# Patient Record
Sex: Female | Born: 2000
Health system: Southern US, Community
[De-identification: ages and names within clinical notes are randomized; demographics above are authoritative.]

---
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
## 2018-01-05 ENCOUNTER — Ambulatory Visit: Payer: 59 | Admitting: Internal Medicine

## 2018-05-26 ENCOUNTER — Other Ambulatory Visit: Payer: Self-pay | Admitting: Internal Medicine

## 2019-04-10 ENCOUNTER — Ambulatory Visit: Payer: 59 | Admitting: Podiatry

## 2019-05-08 ENCOUNTER — Ambulatory Visit: Payer: 59 | Admitting: Podiatry

## 2019-05-16 ENCOUNTER — Telehealth: Payer: Self-pay

## 2019-05-16 NOTE — Telephone Encounter (Signed)
I called to schedule the patient a virtual visit with Dr. Derrel Nip and the phone was disconnected.  Kaitlyn Franko,cma

## 2019-05-24 ENCOUNTER — Ambulatory Visit: Payer: 59 | Admitting: Podiatry

## 2019-06-05 ENCOUNTER — Ambulatory Visit (INDEPENDENT_AMBULATORY_CARE_PROVIDER_SITE_OTHER): Payer: 59 | Admitting: Podiatry

## 2019-06-05 ENCOUNTER — Encounter: Payer: Self-pay | Admitting: Podiatry

## 2019-06-05 ENCOUNTER — Ambulatory Visit (INDEPENDENT_AMBULATORY_CARE_PROVIDER_SITE_OTHER): Payer: 59

## 2019-06-05 ENCOUNTER — Other Ambulatory Visit: Payer: Self-pay

## 2019-06-05 VITALS — BP 114/79 | HR 82 | Resp 16

## 2019-06-05 DIAGNOSIS — S92811A Other fracture of right foot, initial encounter for closed fracture: Secondary | ICD-10-CM

## 2019-06-05 DIAGNOSIS — M2011 Hallux valgus (acquired), right foot: Secondary | ICD-10-CM

## 2019-06-05 DIAGNOSIS — M778 Other enthesopathies, not elsewhere classified: Secondary | ICD-10-CM

## 2019-06-05 NOTE — Progress Notes (Signed)
  Subjective:  Patient ID: Lori Hoffman, female    DOB: 04/13/2001,  MRN: [id number] HPI Chief Complaint  Patient presents with  . Foot Pain    1st MPJ right - aching x couple years, worsened in the last few months, some redness and swelling, takes Ibuprofen, shoes sometimes uncomfortable  . New Patient (Initial Visit)    18 y.o. female presents with the above complaint.   ROS: Denies fever chills nausea vomiting muscle aches pains calf pain back pain chest pain shortness of breath.  No past medical history on file.   Current Outpatient Medications:  .  amoxicillin (AMOXIL) 500 MG capsule, Take 500 mg by mouth 3 (three) times daily., Disp: , Rfl:  .  ASHLYNA 0.15-0.03 &0.01 MG tablet, TAKE 1 TABLET BY MOUTH ONCE DAILY, Disp: 91 tablet, Rfl: 4  Allergies  Allergen Reactions  . Cephalexin     Other reaction(s): Vomiting   Review of Systems Objective:   Vitals:   06/05/19 1537  BP: 114/79  Pulse: 82  Resp: 16    General: Well developed, nourished, in no acute distress, alert and oriented x3   Dermatological: Skin is warm, dry and supple bilateral. Nails x 10 are well maintained; remaining integument appears unremarkable at this time. There are no open sores, no preulcerative lesions, no rash or signs of infection present.  Vascular: Dorsalis Pedis artery and Posterior Tibial artery pedal pulses are 2/4 bilateral with immedate capillary fill time. Pedal hair growth present. No varicosities and no lower extremity edema present bilateral.   Neruologic: Grossly intact via light touch bilateral. Vibratory intact via tuning fork bilateral. Protective threshold with Semmes Wienstein monofilament intact to all pedal sites bilateral. Patellar and Achilles deep tendon reflexes 2+ bilateral. No Babinski or clonus noted bilateral.   Musculoskeletal: No gross boney pedal deformities bilateral. No pain, crepitus, or limitation noted with foot and ankle range of motion bilateral. Muscular  strength 5/5 in all groups tested bilateral.  Gait: Unassisted, Nonantalgic.    Radiographs:  Radiographs taken today demonstrate an osseously mature individual with mild increase in the first intermetatarsal angle of the right foot.  Appears that there is a nondisplaced fracture of the tibial sesamoid.  Some soft tissue edema overlying the first metatarsophalangeal joint.  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    Plan: Discussed in great detail today surgery possibly removing the sesamoid possibly from just correcting the bunion deformity.  So at this point to help delineate what needs to be done we will go ahead and inject the first metatarsophalangeal joint with dexamethasone and local anesthetic I will also follow-up with her in about a month at which time we will discuss MRI.     Julietta Batterman T. [place], [place]

## 2019-06-14 ENCOUNTER — Other Ambulatory Visit: Payer: Self-pay

## 2019-06-19 ENCOUNTER — Encounter: Payer: Self-pay | Admitting: Internal Medicine

## 2019-06-19 ENCOUNTER — Encounter

## 2019-06-19 ENCOUNTER — Other Ambulatory Visit: Payer: Self-pay

## 2019-06-19 ENCOUNTER — Ambulatory Visit (INDEPENDENT_AMBULATORY_CARE_PROVIDER_SITE_OTHER): Payer: 59 | Admitting: Internal Medicine

## 2019-06-19 VITALS — Ht 64.0 in | Wt 131.0 lb

## 2019-06-19 DIAGNOSIS — N921 Excessive and frequent menstruation with irregular cycle: Secondary | ICD-10-CM

## 2019-06-19 DIAGNOSIS — Z13220 Encounter for screening for lipoid disorders: Secondary | ICD-10-CM

## 2019-06-19 DIAGNOSIS — N946 Dysmenorrhea, unspecified: Secondary | ICD-10-CM

## 2019-06-19 DIAGNOSIS — N926 Irregular menstruation, unspecified: Secondary | ICD-10-CM | POA: Diagnosis not present

## 2019-06-19 DIAGNOSIS — F411 Generalized anxiety disorder: Secondary | ICD-10-CM

## 2019-06-19 MED ORDER — LEVONORGEST-ETH ESTRAD 91-DAY 0.15-0.03 &0.01 MG PO TABS: 1.0000 | ORAL_TABLET | Freq: Every day | ORAL | 4 refills | Status: DC

## 2019-06-19 NOTE — Progress Notes (Signed)
Virtual Visit via [email or web address]  This visit type was conducted due to national recommendations for restrictions regarding the COVID-19 pandemic (e.g. social distancing).  This format is felt to be most appropriate for this patient at this time.  All issues noted in this document were discussed and addressed.  No physical exam was performed (except for noted visual exam findings with Video Visits).   I connected with@ on 06/19/19 at  3:30 PM EST by a video enabled telemedicine application and verified that I am speaking with the correct person using two identifiers. Location patient: home Location provider: work or home office Persons participating in the virtual visit: patient, provider  I discussed the limitations, risks, security and privacy concerns of performing an evaluation and management service by telephone and the availability of in person appointments. I also discussed with the patient that there may be a patient responsible charge related to this service. The patient expressed understanding and agreed to proceed.   Reason for visit: irregular menses HPI:  19 yr old female last seen  May 2019 for GAD and started on Effexor..  Did not return for follow up.   presents for discussion of birth control options   POSITIVE FOR COVID 19  IN [place] .  Was asymptomatic .  Only complaint was fatigue lasted 2-3 days   At last visit was taking Seasonale  To reduce frequency of periods given history of dysmenorrhea. Currently taking  Ashlynia  0.15   - 0.03 & 0.01  levonorgesterel-EE continuous cycle,  But having 10 days of bleeding during month 2,  And a very heavy period at the end of Month 3 .  Has not missed any doses and no recent medicatio change.   GAD:  No longer taking Sertraline or Effexor .  Currently a junior at [employer] , has adjusted well to living in a house .   ROS: See pertinent positives and negatives per HPI.  History reviewed. No pertinent past medical history.  History reviewed. No  pertinent surgical history.  Family History  Problem Relation Age of Onset  . Hyperlipidemia Maternal Grandmother   . Hypertension Maternal Grandmother   . Lung cancer Maternal Grandfather   . Breast cancer Paternal Grandmother     SOCIAL HX:  reports that she has never smoked. She has never used smokeless tobacco. She reports that she does not drink alcohol or use drugs.   Current Outpatient Medications:  .  Levonorgestrel-Ethinyl Estradiol (SEASONIQUE) 0.15-0.03 &0.01 MG tablet, Take 1 tablet by mouth daily., Disp: 1 Package, Rfl: 4  EXAM:  VITALS per patient if applicable:  GENERAL: alert, oriented, appears well and in no acute distress  HEENT: atraumatic, conjunttiva clear, no obvious abnormalities on inspection of external nose and ears  NECK: normal movements of the head and neck  LUNGS: on inspection no signs of respiratory distress, breathing rate appears normal, no obvious gross SOB, gasping or wheezing  CV: no obvious cyanosis  MS: moves all visible extremities without noticeable abnormality  PSYCH/NEURO: pleasant and cooperative, no obvious depression or anxiety, speech and thought processing grossly intact  ASSESSMENT AND PLAN:  Discussed the following assessment and plan:  Dysmenorrhea, unspecified  Menometrorrhagia - Plan: Thyroid Panel With TSH, Comprehensive metabolic panel  Screening for lipid disorders - Plan: Lipid panel  Irregular menses  Generalized anxiety disorder  Irregular menses She is taking continuous OCPS but having interval bleeding during month 2 and at the end of the 3 month cycle. She is at the maximal  dose of estrogen and levonorgesterol..  Will try Seasonique and if no improvement will change to 30 day regimen .  Checking thyroid and CMET   Generalized anxiety disorder Resolved .  No longer taking medication     I discussed the assessment and treatment plan with the patient. The patient was provided an opportunity to ask  questions and all were answered. The patient agreed with the plan and demonstrated an understanding of the instructions.   The patient was advised to call back or seek an in-person evaluation if the symptoms worsen or if the condition fails to improve as anticipated.    Crecencio Mc, MD

## 2019-06-19 NOTE — Assessment & Plan Note (Signed)
Resolved .  No longer taking medication

## 2019-06-19 NOTE — Assessment & Plan Note (Addendum)
She is taking continuous OCPS but having interval bleeding during month 2 and at the end of the 3 month cycle. She is at the maximal dose of estrogen and levonorgesterol..  Will try Seasonique and if no improvement will change to 30 day regimen .  Checking thyroid and CMET

## 2019-07-01 MED ORDER — LEVONORGEST-ETH ESTRAD 91-DAY 0.15-0.03 &0.01 MG PO TABS: 1.0000 | ORAL_TABLET | Freq: Every day | ORAL | 0 refills | Status: DC

## 2019-07-13 ENCOUNTER — Other Ambulatory Visit: Payer: Self-pay

## 2019-07-13 MED ORDER — LEVONORGEST-ETH ESTRAD 91-DAY 0.15-0.03 &0.01 MG PO TABS: 1.0000 | ORAL_TABLET | Freq: Every day | ORAL | 0 refills | Status: DC

## 2019-07-31 ENCOUNTER — Telehealth: Payer: Self-pay | Admitting: Internal Medicine

## 2019-07-31 MED ORDER — LEVONORGEST-ETH ESTRAD 91-DAY 0.15-0.03 &0.01 MG PO TABS: 1.0000 | ORAL_TABLET | Freq: Every day | ORAL | 0 refills | Status: DC

## 2019-07-31 NOTE — Telephone Encounter (Signed)
YES`!!   IT WAS SENT ON JAN 28  SO PLEASE CALL PHARMACY AND FIND OUT WHY THEY DID NOT FILL   FYI I WILL NEVER WITHHOLD BIRTH CONTROL JUST BECAUSE OF LABS SO YOU ARE AUTHORIZED TO REFILL

## 2019-07-31 NOTE — Telephone Encounter (Signed)
Medication has been refilled per Dr. Darrick Huntsman message. Pt is aware.

## 2019-07-31 NOTE — Telephone Encounter (Signed)
Pt has a lab appt scheduled for 08/28/2019. Is it okay to fill until then? She has had two other lab appts but has rescheduled them.

## 2019-07-31 NOTE — Telephone Encounter (Signed)
Pt needs a refill on Levonorgestrel-Ethinyl Estradiol (SEASONIQUE) 0.15-0.03 &0.01 MG tablet. Pharmacy states that they have not received refill from our office. Pt is about out of medication and is frustrated. She would like to know if she can come back and pick up rx in office? Please advise.

## 2019-08-01 ENCOUNTER — Telehealth: Payer: Self-pay | Admitting: Internal Medicine

## 2019-08-01 NOTE — Telephone Encounter (Signed)
Spoke with pt and she stated that the birth control that she picked up on 07/24/2019 was still the Ashlyna and she was switched to the [place] in January. I spoke with the pharmacy the first time and they stated that they couldn't fill the rx because she had just picked one up on 07/24/2019. Called pt back explained to pt she stated that her mother picks up the rx because she is in college and her mother didn't know about the change. The pt stated that she wanted something different because she was tired of having her period once every 3 months. When I spoke with the pharmacy again they stated that the reason they filled the rx as the Ashylna was because the pt was going to have to $300 even with insurance and a discount card. Pt is wanting to know if there is another medication that can be sent in that would be cheaper and allow her to have her periods once a month or even once every two months.

## 2019-08-01 NOTE — Telephone Encounter (Signed)
Pt is requesting a call back. She said you left a message for her to call.

## 2019-08-02 MED ORDER — LEVONORGESTREL-ETHINYL ESTRAD 0.15-30 MG-MCG PO TABS: 1.0000 | ORAL_TABLET | Freq: Every day | ORAL | 11 refills | Status: DC

## 2019-08-02 NOTE — Telephone Encounter (Signed)
Yes I have sent in a rw rx for Portia 28,  This will produce a 28 day cycle with a monthly menstrual period .

## 2019-08-02 NOTE — Telephone Encounter (Signed)
Mychart message has been sent to pt.

## 2019-08-04 NOTE — Telephone Encounter (Signed)
err

## 2019-08-07 ENCOUNTER — Other Ambulatory Visit: Payer: 59

## 2019-08-21 ENCOUNTER — Other Ambulatory Visit: Payer: 59

## 2019-08-28 ENCOUNTER — Other Ambulatory Visit (INDEPENDENT_AMBULATORY_CARE_PROVIDER_SITE_OTHER): Payer: 59

## 2019-08-28 ENCOUNTER — Other Ambulatory Visit: Payer: Self-pay

## 2019-08-28 DIAGNOSIS — Z13220 Encounter for screening for lipoid disorders: Secondary | ICD-10-CM

## 2019-08-28 DIAGNOSIS — N921 Excessive and frequent menstruation with irregular cycle: Secondary | ICD-10-CM | POA: Diagnosis not present

## 2019-08-28 LAB — COMPREHENSIVE METABOLIC PANEL
ALT: 14 U/L (ref 0–35)
AST: 17 U/L (ref 0–37)
Albumin: 4.3 g/dL (ref 3.5–5.2)
Alkaline Phosphatase: 52 U/L (ref 39–117)
BUN: 10 mg/dL (ref 6–23)
CO2: 22 mEq/L (ref 19–32)
Calcium: 9.5 mg/dL (ref 8.4–10.5)
Chloride: 103 mEq/L (ref 96–112)
Creatinine, Ser: 0.86 mg/dL (ref 0.40–1.20)
GFR: 83.44 mL/min (ref >60.00)
Glucose, Bld: 91 mg/dL (ref 70–99)
Potassium: 4.4 mEq/L (ref 3.5–5.1)
Sodium: 134 mEq/L — ABNORMAL LOW (ref 135–145)
Total Bilirubin: 0.6 mg/dL (ref 0.2–1.2)
Total Protein: 7 g/dL (ref 6.0–8.3)

## 2019-08-28 LAB — LIPID PANEL
Cholesterol: 127 mg/dL (ref 0–200)
HDL: 47.7 mg/dL (ref >39.00)
LDL Cholesterol: 65 mg/dL (ref 0–99)
NonHDL: 79.23
Total CHOL/HDL Ratio: 3
Triglycerides: 70 mg/dL (ref 0.0–149.0)
VLDL: 14 mg/dL (ref 0.0–40.0)

## 2019-08-29 LAB — THYROID PANEL WITH TSH
Free Thyroxine Index: 2.9 (ref 1.4–3.8)
T3 Uptake: 31 % (ref 22–35)
T4, Total: 9.5 ug/dL (ref 5.3–11.7)
TSH: 1.86 mIU/L

## 2020-04-25 LAB — HM PAP SMEAR: HM Pap smear: NEGATIVE

## 2020-05-22 ENCOUNTER — Other Ambulatory Visit: Payer: Self-pay | Admitting: Internal Medicine

## 2021-01-07 ENCOUNTER — Other Ambulatory Visit: Payer: Self-pay | Admitting: Internal Medicine

## 2021-12-04 ENCOUNTER — Telehealth: Payer: Self-pay

## 2021-12-04 NOTE — Telephone Encounter (Signed)
LMTCB to inquire if she is still a patient here, has not been seen since 2021 if so can I get you an appt scheduled or does she have a new provider?

## 2021-12-04 NOTE — Telephone Encounter (Signed)
Pt called stating she is still a current patient. Pt stated she has to get her schedule and call back because she does not live here

## 2021-12-04 NOTE — Telephone Encounter (Signed)
NOTED

## 2022-05-04 ENCOUNTER — Ambulatory Visit (INDEPENDENT_AMBULATORY_CARE_PROVIDER_SITE_OTHER): Payer: BC Managed Care – PPO | Admitting: Internal Medicine

## 2022-05-04 ENCOUNTER — Encounter: Payer: Self-pay | Admitting: Internal Medicine

## 2022-05-04 VITALS — BP 118/82 | HR 69 | Temp 98.7°F | Ht 64.0 in | Wt 125.2 lb

## 2022-05-04 DIAGNOSIS — N942 Vaginismus: Secondary | ICD-10-CM | POA: Diagnosis not present

## 2022-05-04 DIAGNOSIS — Z79899 Other long term (current) drug therapy: Secondary | ICD-10-CM

## 2022-05-04 DIAGNOSIS — E785 Hyperlipidemia, unspecified: Secondary | ICD-10-CM | POA: Diagnosis not present

## 2022-05-04 DIAGNOSIS — Z0000 Encounter for general adult medical examination without abnormal findings: Secondary | ICD-10-CM

## 2022-05-04 NOTE — Progress Notes (Unsigned)
The patient is here for annual preventive examination and management of other chronic and acute problems.   The risk factors are reflected in the social history.   The roster of all physicians providing medical care to patient - is listed in the Snapshot section of the chart.   Activities of daily living:  The patient is 100% independent in all ADLs: dressing, toileting, feeding as well as independent mobility   Home safety : The patient has smoke detectors in the home. They wear seatbelts.  There are no unsecured firearms at home. There is no violence in the home.    There is no risks for hepatitis, STDs or HIV. There is no   history of blood transfusion. They have no travel history to infectious disease endemic areas of the world.   The patient has seen their dentist in the last six month. They have seen their eye doctor in the last year. The patinet  denies slight hearing difficulty with regard to whispered voices and some television programs.  They have deferred audiologic testing in the last year.  They do not  have excessive sun exposure. Discussed the need for sun protection: hats, long sleeves and use of sunscreen if there is significant sun exposure.    Diet: the importance of a healthy diet is discussed. They do have a healthy diet.   The benefits of regular aerobic exercise were discussed. The patient  exercises  3 to 5 days per week  for  60 minutes.    Depression screen: there are no signs or vegative symptoms of depression- irritability, change in appetite, anhedonia, sadness/tearfullness.   The following portions of the patient's history were reviewed and updated as appropriate: allergies, current medications, past family history, past medical history,  past surgical history, past social history  and problem list.   Visual acuity was not assessed per patient preference since the patient has regular follow up with an  ophthalmologist. Hearing and body mass index were assessed and  reviewed.    During the course of the visit the patient was educated and counseled about appropriate screening and preventive services including : fall prevention , diabetes screening, nutrition counseling, colorectal cancer screening, and recommended immunizations.    Chief Complaint:  HPI     Annual Exam    Additional comments: Physical       Last edited by Sandy Salaam, CMA on 05/04/2022  3:31 PM.        Review of Symptoms  Patient denies headache, fevers, malaise, unintentional weight loss, skin rash, eye pain, sinus congestion and sinus pain, sore throat, dysphagia,  hemoptysis , cough, dyspnea, wheezing, chest pain, palpitations, orthopnea, edema, abdominal pain, nausea, melena, diarrhea, constipation, flank pain, dysuria, hematuria, urinary  Frequency, nocturia, numbness, tingling, seizures,  Focal weakness, Loss of consciousness,  Tremor, insomnia, depression, anxiety, and suicidal ideation.    Physical Exam:  BP 118/82 (BP Location: Right Arm, Patient Position: Sitting, Cuff Size: Normal)   Pulse 69   Temp 98.7 F (37.1 C) (Oral)   Ht 5\' 4"  (1.626 m)   Wt 125 lb 3.2 oz (56.8 kg)   SpO2 99%   BMI 21.49 kg/m    Vaginismus resolved with stopping OCP. No longer having intercourse since 2021  Working remotely for a [employer].  Work/life balance.   Walking  with dogs daily.  Has stayed healthy   Assessment and Plan:  No problem-specific Assessment & Plan notes found for this encounter.   Updated Medication List  Outpatient Encounter Medications as of 05/04/2022  Medication Sig   [DISCONTINUED] ALTAVERA 0.15-30 MG-MCG tablet TAKE 1 TABLET BY MOUTH EVERY DAY (Patient not taking: Reported on 05/04/2022)   No facility-administered encounter medications on file as of 05/04/2022.

## 2022-05-04 NOTE — Patient Instructions (Addendum)
Good to see you!  I strongly advise taking a multi vitamin that contains folic acid.  (To prevent  neural tube defects should you get pregnant

## 2022-05-05 DIAGNOSIS — N942 Vaginismus: Secondary | ICD-10-CM | POA: Insufficient documentation

## 2022-05-05 DIAGNOSIS — Z0000 Encounter for general adult medical examination without abnormal findings: Secondary | ICD-10-CM | POA: Insufficient documentation

## 2022-05-05 LAB — COMPREHENSIVE METABOLIC PANEL
ALT: 9 U/L (ref 0–35)
AST: 14 U/L (ref 0–37)
Albumin: 4.8 g/dL (ref 3.5–5.2)
Alkaline Phosphatase: 51 U/L (ref 39–117)
BUN: 13 mg/dL (ref 6–23)
CO2: 26 mEq/L (ref 19–32)
Calcium: 9.5 mg/dL (ref 8.4–10.5)
Chloride: 104 mEq/L (ref 96–112)
Creatinine, Ser: 0.91 mg/dL (ref 0.40–1.20)
GFR: 88.93 mL/min (ref >60.00)
Glucose, Bld: 81 mg/dL (ref 70–99)
Potassium: 3.6 mEq/L (ref 3.5–5.1)
Sodium: 138 mEq/L (ref 135–145)
Total Bilirubin: 0.4 mg/dL (ref 0.2–1.2)
Total Protein: 7.2 g/dL (ref 6.0–8.3)

## 2022-05-05 LAB — CBC WITH DIFFERENTIAL/PLATELET
Basophils Absolute: 0.1 10*3/uL (ref 0.0–0.1)
Basophils Relative: 0.7 % (ref 0.0–3.0)
Eosinophils Absolute: 0.2 10*3/uL (ref 0.0–0.7)
Eosinophils Relative: 2.4 % (ref 0.0–5.0)
HCT: 38.5 % (ref 36.0–46.0)
Hemoglobin: 13.2 g/dL (ref 12.0–15.0)
Lymphocytes Relative: 33.2 % (ref 12.0–46.0)
Lymphs Abs: 2.6 10*3/uL (ref 0.7–4.0)
MCHC: 34.3 g/dL (ref 30.0–36.0)
MCV: 92.3 fl (ref 78.0–100.0)
Monocytes Absolute: 0.5 10*3/uL (ref 0.1–1.0)
Monocytes Relative: 6.4 % (ref 3.0–12.0)
Neutro Abs: 4.6 10*3/uL (ref 1.4–7.7)
Neutrophils Relative %: 57.3 % (ref 43.0–77.0)
Platelets: 286 10*3/uL (ref 150.0–400.0)
RBC: 4.17 Mil/uL (ref 3.87–5.11)
RDW: 12.7 % (ref 11.5–15.5)
WBC: 8 10*3/uL (ref 4.0–10.5)

## 2022-05-05 LAB — TSH: TSH: 2.12 u[IU]/mL (ref 0.35–5.50)

## 2022-05-05 NOTE — Assessment & Plan Note (Signed)

## 2022-05-05 NOTE — Assessment & Plan Note (Signed)
Diagnosed by GYN several years ago. She has stopped using ocps and is not sexually active. Symptoms are not present

## 2023-05-06 ENCOUNTER — Encounter: Payer: BC Managed Care – PPO | Admitting: Internal Medicine
